# Patient Record
Sex: Male | Born: 1980 | Race: Black or African American | Hispanic: No | Marital: Single | Smoking: Current every day smoker
Health system: Southern US, Community
[De-identification: ages and names within clinical notes are randomized; demographics above are authoritative.]

---

## 2016-07-05 ENCOUNTER — Emergency Department
Admission: EM | Admit: 2016-07-05 | Discharge: 2016-07-05 | Disposition: A | Discharge status: Home / Self Care | Payer: Self-pay | Attending: Emergency Medicine | Admitting: Emergency Medicine

## 2016-07-05 ENCOUNTER — Encounter: Payer: Self-pay | Admitting: *Deleted

## 2016-07-05 DIAGNOSIS — R197 Diarrhea, unspecified: Secondary | ICD-10-CM

## 2016-07-05 DIAGNOSIS — F17200 Nicotine dependence, unspecified, uncomplicated: Secondary | ICD-10-CM | POA: Insufficient documentation

## 2016-07-05 LAB — COMPREHENSIVE METABOLIC PANEL
ALBUMIN: 3.8 g/dL (ref 3.5–5.0)
ALT: 17 U/L (ref 17–63)
ANION GAP: 4 — AB (ref 5–15)
AST: 24 U/L (ref 15–41)
Alkaline Phosphatase: 47 U/L (ref 38–126)
BILIRUBIN TOTAL: 0.5 mg/dL (ref 0.3–1.2)
BUN: 10 mg/dL (ref 6–20)
CO2: 27 mmol/L (ref 22–32)
Calcium: 8.9 mg/dL (ref 8.9–10.3)
Chloride: 108 mmol/L (ref 101–111)
Creatinine, Ser: 1.06 mg/dL (ref 0.61–1.24)
GLUCOSE: 88 mg/dL (ref 65–99)
POTASSIUM: 3.8 mmol/L (ref 3.5–5.1)
Sodium: 139 mmol/L (ref 135–145)
TOTAL PROTEIN: 6.9 g/dL (ref 6.5–8.1)

## 2016-07-05 LAB — CBC
HEMATOCRIT: 41 % (ref 40.0–52.0)
HEMOGLOBIN: 13.9 g/dL (ref 13.0–18.0)
MCH: 31.8 pg (ref 26.0–34.0)
MCHC: 33.8 g/dL (ref 32.0–36.0)
MCV: 93.9 fL (ref 80.0–100.0)
Platelets: 204 10*3/uL (ref 150–440)
RBC: 4.36 MIL/uL — AB (ref 4.40–5.90)
RDW: 13.8 % (ref 11.5–14.5)
WBC: 6 10*3/uL (ref 3.8–10.6)

## 2016-07-05 LAB — LIPASE, BLOOD: Lipase: 23 U/L (ref 11–51)

## 2016-07-05 MED ORDER — LOPERAMIDE HCL 2 MG PO TABS: 2.0000 mg | ORAL_TABLET | Freq: Four times a day (QID) | ORAL | 0 refills | Status: AC | PRN

## 2016-07-05 NOTE — ED Provider Notes (Signed)
[facility] Emergency Department Provider Note   ____________________________________________    I have reviewed the triage vital signs and the nursing notes.   HISTORY  Chief Complaint Abdominal Pain and Diarrhea     HPI Brad Harrison is a 35 y.o. male who presents with complaints of lower abdominal cramping and diarrhea. This started yesterday after he ate a [place] cheese steak pizza from [employer]. He reports today he is actually feeling better because his cramping has lessened. He had nausea but no vomiting. No fevers or chills. No recent travel. No sick contacts.   History reviewed. No pertinent past medical history.  There are no active problems to display for this patient.   History reviewed. No pertinent surgical history.  Prior to Admission medications   Medication Sig Start Date End Date Taking? Authorizing Provider  loperamide (IMODIUM A-D) 2 MG tablet Take 1 tablet (2 mg total) by mouth 4 (four) times daily as needed for diarrhea or loose stools. 07/05/16   Jene Everyobert Mikeisha Lemonds, MD     Allergies Jonne PlyAsa [aspirin]  History reviewed. No pertinent family history.  Social History Social History  Substance Use Topics  . Smoking status: Current Every Day Smoker  . Smokeless tobacco: Not on file  . Alcohol use No    Review of Systems  Constitutional: No fever/chills  Cardiovascular: Denies chest pain. Respiratory: Denies shortness of breath. Gastrointestinal: As above  Skin: Negative for rash. Neurological: Negative for headaches   10-point ROS otherwise negative.  ____________________________________________   PHYSICAL EXAM:  VITAL SIGNS: ED Triage Vitals  Enc Vitals Group     BP 07/05/16 0910 116/70     Pulse Rate 07/05/16 0910 60     Resp 07/05/16 0910 18     Temp 07/05/16 0910 97.8 F (36.6 C)     Temp Source 07/05/16 0910 Oral     SpO2 07/05/16 0910 100 %     Weight 07/05/16 0910 140 lb (63.5 kg)     Height  07/05/16 0910 5\' 10"  (1.778 m)     Head Circumference --      Peak Flow --      Pain Score 07/05/16 0913 6     Pain Loc --      Pain Edu? --      Excl. in GC? --     Constitutional: Alert and oriented. No acute distress. Pleasant and interactive Eyes: Conjunctivae are normal.  Head: Atraumatic. Nose: No congestion/rhinnorhea. Mouth/Throat: Mucous membranes are moist.   Neck:  Painless ROM Cardiovascular: Normal rate, regular rhythm.   Good peripheral circulation. Respiratory: Normal respiratory effort.  No retractions.  Gastrointestinal: Soft and nontender. No distention.  No CVA tenderness. Genitourinary: deferred Musculoskeletal: No lower extremity tenderness nor edema.  Warm and well perfused Neurologic:  Normal speech and language. No gross focal neurologic deficits are appreciated.  Skin:  Skin is warm, dry and intact. No rash noted. Psychiatric: Mood and affect are normal. Speech and behavior are normal.  ____________________________________________   LABS (all labs ordered are listed, but only abnormal results are displayed)  Labs Reviewed  COMPREHENSIVE METABOLIC PANEL - Abnormal; Notable for the following:       Result Value   Anion gap 4 (*)    All other components within normal limits  CBC - Abnormal; Notable for the following:    RBC 4.36 (*)    All other components within normal limits  LIPASE, BLOOD  URINALYSIS COMPLETEWITH MICROSCOPIC ([facility] ONLY)  ____________________________________________  EKG  None ____________________________________________  RADIOLOGY  None ____________________________________________   PROCEDURES  Procedure(s) performed: No    Critical Care performed: No ____________________________________________   INITIAL IMPRESSION / ASSESSMENT AND PLAN / ED COURSE  Pertinent labs & imaging results that were available during my care of the patient were reviewed by me and considered in my medical decision making (see chart  for details).  Patient well-appearing in no distress. Abdominal exam is benign, lab work is unremarkable. Vitals are normal. Recommend supportive care  Clinical Course    ____________________________________________   FINAL CLINICAL IMPRESSION(S) / ED DIAGNOSES  Final diagnoses:  Diarrhea, unspecified type      NEW MEDICATIONS STARTED DURING THIS VISIT:  Discharge Medication List as of 07/05/2016 10:14 AM    START taking these medications   Details  loperamide (IMODIUM A-D) 2 MG tablet Take 1 tablet (2 mg total) by mouth 4 (four) times daily as needed for diarrhea or loose stools., Starting Sun 07/05/2016, Print         Note:  This document was prepared using Dragon voice recognition software and may include unintentional dictation errors.    Jene Everyobert Archibald Marchetta, MD 07/05/16 [phone]

## 2016-07-05 NOTE — ED Triage Notes (Signed)
States he ate [employer] yesterday about 4 pm and states abd pain and diarrhea since, states no relief with OTC meds

## 2018-01-03 ENCOUNTER — Encounter: Payer: Self-pay | Admitting: Emergency Medicine

## 2018-01-03 ENCOUNTER — Other Ambulatory Visit: Payer: Self-pay

## 2018-01-03 ENCOUNTER — Emergency Department
Admission: EM | Admit: 2018-01-03 | Discharge: 2018-01-03 | Disposition: A | Discharge status: Home / Self Care | Payer: Self-pay | Attending: Emergency Medicine | Admitting: Emergency Medicine

## 2018-01-03 DIAGNOSIS — F17210 Nicotine dependence, cigarettes, uncomplicated: Secondary | ICD-10-CM | POA: Insufficient documentation

## 2018-01-03 DIAGNOSIS — R221 Localized swelling, mass and lump, neck: Secondary | ICD-10-CM | POA: Insufficient documentation

## 2018-01-03 LAB — CBC WITH DIFFERENTIAL/PLATELET
Basophils Absolute: 0 10*3/uL (ref 0–0.1)
Basophils Relative: 1 %
EOS ABS: 0.2 10*3/uL (ref 0–0.7)
EOS PCT: 3 %
HCT: 43.3 % (ref 40.0–52.0)
Hemoglobin: 14.4 g/dL (ref 13.0–18.0)
LYMPHS ABS: 1.8 10*3/uL (ref 1.0–3.6)
LYMPHS PCT: 29 %
MCH: 32 pg (ref 26.0–34.0)
MCHC: 33.3 g/dL (ref 32.0–36.0)
MCV: 96.3 fL (ref 80.0–100.0)
MONO ABS: 0.7 10*3/uL (ref 0.2–1.0)
MONOS PCT: 11 %
Neutro Abs: 3.5 10*3/uL (ref 1.4–6.5)
Neutrophils Relative %: 56 %
PLATELETS: 258 10*3/uL (ref 150–440)
RBC: 4.5 MIL/uL (ref 4.40–5.90)
RDW: 14.2 % (ref 11.5–14.5)
WBC: 6.2 10*3/uL (ref 3.8–10.6)

## 2018-01-03 LAB — COMPREHENSIVE METABOLIC PANEL
ALT: 28 U/L (ref 17–63)
ANION GAP: 6 (ref 5–15)
AST: 39 U/L (ref 15–41)
Albumin: 3.7 g/dL (ref 3.5–5.0)
Alkaline Phosphatase: 56 U/L (ref 38–126)
BUN: 20 mg/dL (ref 6–20)
CHLORIDE: 107 mmol/L (ref 101–111)
CO2: 27 mmol/L (ref 22–32)
CREATININE: 1.09 mg/dL (ref 0.61–1.24)
Calcium: 8.9 mg/dL (ref 8.9–10.3)
Glucose, Bld: 84 mg/dL (ref 65–99)
Potassium: 3.8 mmol/L (ref 3.5–5.1)
SODIUM: 140 mmol/L (ref 135–145)
Total Bilirubin: 0.6 mg/dL (ref 0.3–1.2)
Total Protein: 6.8 g/dL (ref 6.5–8.1)

## 2018-01-03 NOTE — ED Provider Notes (Addendum)
[facility] Emergency Department Provider Note  ___________________________________________   First MD Initiated Contact with Patient 01/03/18 1327     (approximate)  I have reviewed the triage vital signs and the nursing notes.   HISTORY  Chief Complaint Mass   HPI Brad Harrison is a 37 y.o. male that any chronic medical conditions was presenting with a mass to the right side of his neck.  He says that the swelling and growth of this mass has been ongoing over the past 2 months and now it is becoming uncomfortable for him to sleep at night.  He denies any difficulty with swallowing or breathing.  Says that he does smoke.  Denies any dental pain.  Denies any weight loss or night sweats or fever.  Has never had swelling like this before.  No reported injury.  Does not have a primary care doctor.  Does not take any medications at home.  Denies any unintended weight loss.  History reviewed. No pertinent past medical history.  There are no active problems to display for this patient.   History reviewed. No pertinent surgical history.  Prior to Admission medications   Medication Sig Start Date End Date Taking? Authorizing Provider  loperamide (IMODIUM A-D) 2 MG tablet Take 1 tablet (2 mg total) by mouth 4 (four) times daily as needed for diarrhea or loose stools. 07/05/16   Jene Every, MD    Allergies Asa [aspirin]  No family history on file.  Social History Social History   Tobacco Use  . Smoking status: Current Every Day Smoker    Packs/day: 0.50    Types: Cigarettes  Substance Use Topics  . Alcohol use: No  . Drug use: Not on file    Review of Systems  Constitutional: No fever/chills Eyes: No visual changes. ENT: No sore throat. Cardiovascular: Denies chest pain. Respiratory: Denies shortness of breath. Gastrointestinal: No abdominal pain.  No nausea, no vomiting.  No diarrhea.  No constipation. Genitourinary: Negative for  dysuria. Musculoskeletal: Negative for back pain. Skin: Negative for rash. Neurological: Negative for headaches, focal weakness or numbness.   ____________________________________________   PHYSICAL EXAM:  VITAL SIGNS: ED Triage Vitals [01/03/18 1138]  Enc Vitals Group     BP (!) 132/100     Pulse Rate 81     Resp 20     Temp 98.3 F (36.8 C)     Temp Source Oral     SpO2 100 %     Weight 131 lb (59.4 kg)     Height 5\' 7"  (1.702 m)     Head Circumference      Peak Flow      Pain Score 8     Pain Loc      Pain Edu?      Excl. in GC?     Constitutional: Alert and oriented. Well appearing and in no acute distress. Eyes: Conjunctivae are normal.  Head: Atraumatic. Nose: No congestion/rhinnorhea. Mouth/Throat: Mucous membranes are moist.  Poor dentition.  No intraoral swelling or lesions. Neck: No stridor.    Large, soft mass to the right proximal side of the neck just inferior to the mandibular angle.  The mass is approximately 10 x 6 cm and oval-shaped.  It is mobile and soft and nontender.  It is nonpulsatile.  There is no overlying induration.  No warmth.  I do not feel fluctuance.  Cardiovascular: Normal rate, regular rhythm. Grossly normal heart sounds.  Good peripheral circulation. Respiratory: Normal  respiratory effort.  No retractions. Lungs CTAB. Gastrointestinal: Soft and nontender. No distention. No CVA tenderness. Musculoskeletal: No lower extremity tenderness nor edema.  No joint effusions. Neurologic:  Normal speech and language. No gross focal neurologic deficits are appreciated. Skin:  Skin is warm, dry and intact. No rash noted. Psychiatric: Mood and affect are normal. Speech and behavior are normal.  ____________________________________________   LABS (all labs ordered are listed, but only abnormal results are displayed)  Labs Reviewed  CBC WITH DIFFERENTIAL/PLATELET  COMPREHENSIVE METABOLIC PANEL    ____________________________________________  EKG   ____________________________________________  RADIOLOGY   ____________________________________________   PROCEDURES  Procedure(s) performed:   Procedures  Critical Care performed:   ____________________________________________   INITIAL IMPRESSION / ASSESSMENT AND PLAN / ED COURSE  Pertinent labs & imaging results that were available during my care of the patient were reviewed by me and considered in my medical decision making (see chart for details).  DDX: Enlarged lymph node, necrotic lymph node, abscess, infected cyst, congenital abnormality As part of my medical decision making, I reviewed the following data within the electronic MEDICAL RECORD NUMBER Notes from prior ED visits  Patient says that he is unable to stay for imaging as he needs to pick up his children.  Patient with reassuring blood work.  Mass is mobile and nonpulsatile.  I recommended that the patient stay for imaging to further differentiate the mass.  However, he says that he will likely need to return to the emergency department.  Is not of any outpatient follow-up.  He is aware that I am unable to make any definitive diagnosis based on lack of information at this time.  He is understanding of this plan and willing to comply.  Will be discharged home.  The patient appears well.  Is not in any distress.  Speaking with a normal voice.  ____________________________________________   FINAL CLINICAL IMPRESSION(S) / ED DIAGNOSES  Neck mass.    NEW MEDICATIONS STARTED DURING THIS VISIT:  New Prescriptions   No medications on file     Note:  This document was prepared using Dragon voice recognition software and may include unintentional dictation errors.     Schaevitz, Myra Rudeavid Matthew, MD 01/03/18 1417  Note sent to Dr. Smith Robertao through epic alerting her of the patient's visit and the elevated white blood cell count.    Myrna BlazerSchaevitz, David Matthew,  MD 01/03/18 1430

## 2018-01-03 NOTE — ED Triage Notes (Signed)
First Nurse Note:  C/O right neck swelling for several months.  Arrives today with C/O pain to neck at area of swelling.

## 2018-01-03 NOTE — ED Notes (Signed)
Pt has large knot noted on left side of throat.

## 2018-01-03 NOTE — ED Triage Notes (Signed)
Large fluctuant mass L neck x 2 months. Denies injury. Denies fevers. No resp distress. Became painful x 2 days.

## 2018-06-23 ENCOUNTER — Other Ambulatory Visit: Payer: Self-pay

## 2018-06-23 ENCOUNTER — Emergency Department: Payer: Self-pay

## 2018-06-23 ENCOUNTER — Encounter: Payer: Self-pay | Admitting: Emergency Medicine

## 2018-06-23 ENCOUNTER — Emergency Department
Admission: EM | Admit: 2018-06-23 | Discharge: 2018-06-23 | Disposition: A | Discharge status: Home / Self Care | Payer: Self-pay | Attending: Emergency Medicine | Admitting: Emergency Medicine

## 2018-06-23 DIAGNOSIS — F17210 Nicotine dependence, cigarettes, uncomplicated: Secondary | ICD-10-CM | POA: Insufficient documentation

## 2018-06-23 DIAGNOSIS — R221 Localized swelling, mass and lump, neck: Secondary | ICD-10-CM | POA: Insufficient documentation

## 2018-06-23 LAB — COMPREHENSIVE METABOLIC PANEL
ALBUMIN: 3.4 g/dL — AB (ref 3.5–5.0)
ALK PHOS: 55 U/L (ref 38–126)
ALT: 17 U/L (ref 0–44)
ANION GAP: 12 (ref 5–15)
AST: 29 U/L (ref 15–41)
BUN: 13 mg/dL (ref 6–20)
CHLORIDE: 99 mmol/L (ref 98–111)
CO2: 27 mmol/L (ref 22–32)
Calcium: 8.7 mg/dL — ABNORMAL LOW (ref 8.9–10.3)
Creatinine, Ser: 1.02 mg/dL (ref 0.61–1.24)
GFR calc non Af Amer: >60 mL/min (ref >60)
GLUCOSE: 135 mg/dL — AB (ref 70–99)
POTASSIUM: 3.5 mmol/L (ref 3.5–5.1)
SODIUM: 138 mmol/L (ref 135–145)
Total Bilirubin: 0.8 mg/dL (ref 0.3–1.2)
Total Protein: 7.4 g/dL (ref 6.5–8.1)

## 2018-06-23 LAB — CBC WITH DIFFERENTIAL/PLATELET
Abs Immature Granulocytes: 0.05 10*3/uL (ref 0.00–0.07)
BASOS ABS: 0 10*3/uL (ref 0.0–0.1)
BASOS PCT: 0 %
EOS PCT: 2 %
Eosinophils Absolute: 0.2 10*3/uL (ref 0.0–0.5)
HCT: 40.2 % (ref 39.0–52.0)
HEMOGLOBIN: 13.6 g/dL (ref 13.0–17.0)
Immature Granulocytes: 0 %
LYMPHS PCT: 20 %
Lymphs Abs: 2.3 10*3/uL (ref 0.7–4.0)
MCH: 31.3 pg (ref 26.0–34.0)
MCHC: 33.8 g/dL (ref 30.0–36.0)
MCV: 92.4 fL (ref 80.0–100.0)
Monocytes Absolute: 1.1 10*3/uL — ABNORMAL HIGH (ref 0.1–1.0)
Monocytes Relative: 10 %
NEUTROS ABS: 7.5 10*3/uL (ref 1.7–7.7)
NEUTROS PCT: 68 %
PLATELETS: 322 10*3/uL (ref 150–400)
RBC: 4.35 MIL/uL (ref 4.22–5.81)
RDW: 12.8 % (ref 11.5–15.5)
WBC: 11.2 10*3/uL — ABNORMAL HIGH (ref 4.0–10.5)
nRBC: 0 % (ref 0.0–0.2)

## 2018-06-23 MED ORDER — IOHEXOL 300 MG/ML  SOLN
75.0000 mL | Freq: Once | INTRAMUSCULAR | Status: AC | PRN
  Administered 2018-06-23: 75 mL via INTRAVENOUS

## 2018-06-23 NOTE — Discharge Instructions (Signed)
You have been seen in the emergency department today for a neck mass.  This is most consistent with a cystic mass although cancer is less likely, it would remain a possibility until a sample could be obtained.  I discussed your case with ENT surgeon Dr. Andee PolesVaught.  They are expecting a phone call to arrange an appointment to be seen in their office.  Return to the emergency department for any trouble breathing or swallowing, any fever or pain to this area.

## 2018-06-23 NOTE — ED Triage Notes (Addendum)
Pt presents to ED with c/o abscess to R neck that he has had for approx 5 months. Pt denies difficulty swallowing, no difficulty speaking, no airway obstruction. Pt with approx baseball sized abscess to R neck at this time. Abscess is hard to palpation at this time. Pt denies any pain at this time.

## 2018-06-23 NOTE — ED Provider Notes (Signed)
[facility] Emergency Department Provider Note  Time seen: 5:02 PM  I have reviewed the triage vital signs and the nursing notes.   HISTORY  Chief Complaint Abscess    HPI Brad Harrison is a 37 y.o. male with no past medical history who presents to the emergency department for right-sided neck mass.  According to the patient over the past 6 months he has had progressively growing mass to the right side of his neck.  Patient states it is not tender, denies any fever.  Denies any trouble swallowing or breathing.  Patient states this is the first day that he has had transportation available in childcare available so he came to the ER for evaluation.  Overall the patient appears well, no distress, reassuring vitals.   History reviewed. No pertinent past medical history.  There are no active problems to display for this patient.   History reviewed. No pertinent surgical history.  Prior to Admission medications   Medication Sig Start Date End Date Taking? Authorizing Provider  loperamide (IMODIUM A-D) 2 MG tablet Take 1 tablet (2 mg total) by mouth 4 (four) times daily as needed for diarrhea or loose stools. 07/05/16   Jene EveryKinner, Robert, MD    Allergies  Allergen Reactions  . Asa [Aspirin]     History reviewed. No pertinent family history.  Social History Social History   Tobacco Use  . Smoking status: Current Every Day Smoker    Packs/day: 0.50    Types: Cigarettes  . Smokeless tobacco: Never Used  Substance Use Topics  . Alcohol use: Yes    Comment: Occ  . Drug use: Yes    Frequency: 2.0 times per week    Types: Marijuana    Review of Systems Constitutional: Negative for fever. ENT: Large swelling/mass to right side of neck Cardiovascular: Negative for chest pain. Respiratory: Negative for shortness of breath. Gastrointestinal: Negative for abdominal pain Skin: Negative for skin rash or redness. Neurological: Negative for headache All  other ROS negative  ____________________________________________   PHYSICAL EXAM:  VITAL SIGNS: ED Triage Vitals  Enc Vitals Group     BP 06/23/18 1422 122/75     Pulse Rate 06/23/18 1422 87     Resp --      Temp 06/23/18 1422 97.7 F (36.5 C)     Temp Source 06/23/18 1422 Oral     SpO2 06/23/18 1422 100 %     Weight 06/23/18 1436 140 lb (63.5 kg)     Height 06/23/18 1436 5\' 8"  (1.727 m)     Head Circumference --      Peak Flow --      Pain Score 06/23/18 1436 0     Pain Loc --      Pain Edu? --      Excl. in GC? --    Constitutional: Alert and oriented. Well appearing and in no distress. Eyes: Normal exam ENT   Head: Normocephalic and atraumatic.   Mouth/Throat: Mucous membranes are moist.  Very large appearing mass on the right side of the neck, nontender to palpation.  No overlying skin color changes. Cardiovascular: Normal rate, regular rhythm. No murmur Respiratory: Normal respiratory effort without tachypnea nor retractions. Breath sounds are clear  Gastrointestinal: Soft and nontender. No distention.  Musculoskeletal: Nontender with normal range of motion in all extremities.  Neurologic:  Normal speech and language. No gross focal neurologic deficits  Skin:  Skin is warm, dry and intact.  Psychiatric: Mood and affect  are normal.    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  Differential would include neoplasm as well.  ____________________________________________   INITIAL IMPRESSION / ASSESSMENT AND PLAN / ED COURSE  Pertinent labs & imaging results that were available during my care of the patient were reviewed by me and considered in my medical decision making (see chart for details).  Patient presents to the emergency department for 6 months of progressively enlarging right neck mass.  The mass is nontender to palpation, no erythema or skin color  changes, no fever, no trouble swallowing or breathing.  Labs were done showing a very slight leukocytosis otherwise within normal limits.  Vitals are within normal limits including afebrile.  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  Given nontender with no skin color changes and normal vitals I do not suspect abscess.  Patient states the only reason he came today versus any other day was because he had transportation available.  I discussed the patient with Dr. Bud Face of ENT, he states that this is a simple appearing cyst the patient would benefit from removal and he would be happy to see the patient in the office.  I discussed this with the patient, he is agreeable to this plan of care.  ____________________________________________   FINAL CLINICAL IMPRESSION(S) / ED DIAGNOSES  Cystic mass on neck    Minna Antis, MD 06/23/18 1710

## 2018-07-11 ENCOUNTER — Encounter: Payer: Self-pay | Admitting: Emergency Medicine

## 2018-07-11 ENCOUNTER — Emergency Department
Admission: EM | Admit: 2018-07-11 | Discharge: 2018-07-11 | Disposition: A | Discharge status: Home / Self Care | Payer: Self-pay | Attending: Student in an Organized Health Care Education/Training Program | Admitting: Student in an Organized Health Care Education/Training Program

## 2018-07-11 ENCOUNTER — Other Ambulatory Visit: Payer: Self-pay

## 2018-07-11 ENCOUNTER — Emergency Department: Payer: Self-pay

## 2018-07-11 DIAGNOSIS — J020 Streptococcal pharyngitis: Secondary | ICD-10-CM | POA: Insufficient documentation

## 2018-07-11 DIAGNOSIS — R221 Localized swelling, mass and lump, neck: Secondary | ICD-10-CM

## 2018-07-11 DIAGNOSIS — F17210 Nicotine dependence, cigarettes, uncomplicated: Secondary | ICD-10-CM | POA: Insufficient documentation

## 2018-07-11 DIAGNOSIS — F1210 Cannabis abuse, uncomplicated: Secondary | ICD-10-CM | POA: Insufficient documentation

## 2018-07-11 LAB — COMPREHENSIVE METABOLIC PANEL
ALT: 28 U/L (ref 0–44)
ANION GAP: 12 (ref 5–15)
AST: 31 U/L (ref 15–41)
Albumin: 3.5 g/dL (ref 3.5–5.0)
Alkaline Phosphatase: 53 U/L (ref 38–126)
BUN: 14 mg/dL (ref 6–20)
CHLORIDE: 98 mmol/L (ref 98–111)
CO2: 21 mmol/L — AB (ref 22–32)
CREATININE: 1.04 mg/dL (ref 0.61–1.24)
Calcium: 8.8 mg/dL — ABNORMAL LOW (ref 8.9–10.3)
GFR calc Af Amer: >60 mL/min (ref >60)
GFR calc non Af Amer: >60 mL/min (ref >60)
GLUCOSE: 165 mg/dL — AB (ref 70–99)
Potassium: 3.6 mmol/L (ref 3.5–5.1)
SODIUM: 131 mmol/L — AB (ref 135–145)
Total Bilirubin: 0.8 mg/dL (ref 0.3–1.2)
Total Protein: 8.7 g/dL — ABNORMAL HIGH (ref 6.5–8.1)

## 2018-07-11 LAB — CBC WITH DIFFERENTIAL/PLATELET
ABS IMMATURE GRANULOCYTES: 0.07 10*3/uL (ref 0.00–0.07)
Basophils Absolute: 0.1 10*3/uL (ref 0.0–0.1)
Basophils Relative: 0 %
EOS PCT: 1 %
Eosinophils Absolute: 0.2 10*3/uL (ref 0.0–0.5)
HEMATOCRIT: 41.6 % (ref 39.0–52.0)
HEMOGLOBIN: 13.5 g/dL (ref 13.0–17.0)
Immature Granulocytes: 1 %
LYMPHS ABS: 1.8 10*3/uL (ref 0.7–4.0)
LYMPHS PCT: 12 %
MCH: 30.3 pg (ref 26.0–34.0)
MCHC: 32.5 g/dL (ref 30.0–36.0)
MCV: 93.3 fL (ref 80.0–100.0)
MONO ABS: 1.5 10*3/uL — AB (ref 0.1–1.0)
Monocytes Relative: 10 %
NEUTROS ABS: 11.3 10*3/uL — AB (ref 1.7–7.7)
Neutrophils Relative %: 76 %
Platelets: 461 10*3/uL — ABNORMAL HIGH (ref 150–400)
RBC: 4.46 MIL/uL (ref 4.22–5.81)
RDW: 13 % (ref 11.5–15.5)
WBC: 14.9 10*3/uL — ABNORMAL HIGH (ref 4.0–10.5)
nRBC: 0 % (ref 0.0–0.2)

## 2018-07-11 LAB — GROUP A STREP BY PCR: Group A Strep by PCR: DETECTED — AB

## 2018-07-11 MED ORDER — DEXAMETHASONE SODIUM PHOSPHATE 10 MG/ML IJ SOLN
INTRAMUSCULAR | Status: AC
  Filled 2018-07-11: qty 1

## 2018-07-11 MED ORDER — HYDROCODONE-ACETAMINOPHEN 5-325 MG PO TABS: 1.0000 | ORAL_TABLET | ORAL | 0 refills | Status: AC | PRN

## 2018-07-11 MED ORDER — AMOXICILLIN 500 MG PO CAPS: 500.0000 mg | ORAL_CAPSULE | Freq: Three times a day (TID) | ORAL | 0 refills | Status: AC

## 2018-07-11 MED ORDER — DEXAMETHASONE 1 MG/ML PO CONC
10.0000 mg | Freq: Once | ORAL | Status: AC
  Administered 2018-07-11: 10 mg via ORAL
  Filled 2018-07-11: qty 10

## 2018-07-11 MED ORDER — HYDROCODONE-ACETAMINOPHEN 5-325 MG PO TABS
1.0000 | ORAL_TABLET | Freq: Once | ORAL | Status: AC
  Administered 2018-07-11: 1 via ORAL
  Filled 2018-07-11: qty 1

## 2018-07-11 NOTE — ED Triage Notes (Signed)
Says abscess on right side of neck for about 7 months.  Increased pain for 2 days.  Says he is having trouble swollowing and he hasnt been able to eat. No fever.

## 2018-07-11 NOTE — ED Notes (Signed)
Waiting on pharmacy to send up medication

## 2018-07-11 NOTE — ED Provider Notes (Signed)
[facility] Emergency Department Provider Note    First MD Initiated Contact with Patient 07/11/18 1212     (approximate)  I have reviewed the triage vital signs and the nursing notes.   HISTORY  Chief Complaint Abscess    HPI Brad Harrison is a 37 y.o. male presents the ER for evaluation of swelling and pain to his right neck.  States he is had a "abscess "there for the past 7 months.  States he has been seen multiple times but was referred to ENT and could not afford treatment.   Denies any fevers.  States it hurts whenever he swallows.  This is new.  Does not have any change of voice.  Family states that this is been steadily progressing over the past 7months.  Denies any nausea or vomiting.  No numbness or tingling.  No chest pain or shortness of breath.   History reviewed. No pertinent past medical history. No family history on file. History reviewed. No pertinent surgical history. There are no active problems to display for this patient.     Prior to Admission medications   Medication Sig Start Date End Date Taking? Authorizing Provider  amoxicillin (AMOXIL) 500 MG capsule Take 1 capsule (500 mg total) by mouth 3 (three) times daily for 7 days. 07/11/18 07/18/18  Willy Eddyobinson, Denesha Brouse, MD  HYDROcodone-acetaminophen (NORCO) 5-325 MG tablet Take 1 tablet by mouth every 4 (four) hours as needed for moderate pain. 07/11/18   Willy Eddyobinson, Keller Bounds, MD  loperamide (IMODIUM A-D) 2 MG tablet Take 1 tablet (2 mg total) by mouth 4 (four) times daily as needed for diarrhea or loose stools. 07/05/16   Jene EveryKinner, Robert, MD    Allergies Jonne PlyAsa [aspirin]    Social History Social History   Tobacco Use  . Smoking status: Current Every Day Smoker    Packs/day: 0.50    Types: Cigarettes  . Smokeless tobacco: Never Used  Substance Use Topics  . Alcohol use: Yes    Comment: Occ  . Drug use: Yes    Frequency: 2.0 times per week    Types: Marijuana    Review of  Systems Patient denies headaches, rhinorrhea, blurry vision, numbness, shortness of breath, chest pain, edema, cough, abdominal pain, nausea, vomiting, diarrhea, dysuria, fevers, rashes or hallucinations unless otherwise stated above in HPI. ____________________________________________   PHYSICAL EXAM:  VITAL SIGNS: Vitals:   07/11/18 1434 07/11/18 1503  BP:  113/85  Pulse: 95 98  Resp:  18  Temp:    SpO2: 96% 98%    Constitutional: Alert and oriented.  Eyes: Conjunctivae are normal.  Head: Atraumatic. Nose: No congestion/rhinnorhea. Mouth/Throat: Mucous membranes are moist.   Neck: No stridor. Painless ROM.  Cardiovascular: Normal rate, regular rhythm. Grossly normal heart sounds.  Good peripheral circulation. Respiratory: Normal respiratory effort.  No retractions. Lungs CTAB. Gastrointestinal: Soft and nontender. No distention. No abdominal bruits. No CVA tenderness. Genitourinary:  Musculoskeletal: No lower extremity tenderness nor edema.  No joint effusions. Neurologic:  Normal speech and language. No gross focal neurologic deficits are appreciated. No facial droop Skin:  Skin is warm, dry and intact. No rash noted. Psychiatric: Mood and affect are normal. Speech and behavior are normal.  ____________________________________________   LABS (all labs ordered are listed, but only abnormal results are displayed)  Results for orders placed or performed during the hospital encounter of 07/11/18 (from the past 24 hour(s))  Comprehensive metabolic panel     Status: Abnormal   Collection Time: 07/11/18  10:48 AM  Result Value Ref Range   Sodium 131 (L) 135 - 145 mmol/L   Potassium 3.6 3.5 - 5.1 mmol/L   Chloride 98 98 - 111 mmol/L   CO2 21 (L) 22 - 32 mmol/L   Glucose, Bld 165 (H) 70 - 99 mg/dL   BUN 14 6 - 20 mg/dL   Creatinine, Ser 1.61 0.61 - 1.24 mg/dL   Calcium 8.8 (L) 8.9 - 10.3 mg/dL   Total Protein 8.7 (H) 6.5 - 8.1 g/dL   Albumin 3.5 3.5 - 5.0 g/dL   AST 31 15  - 41 U/L   ALT 28 0 - 44 U/L   Alkaline Phosphatase 53 38 - 126 U/L   Total Bilirubin 0.8 0.3 - 1.2 mg/dL   GFR calc non Af Amer >60 >60 mL/min   GFR calc Af Amer >60 >60 mL/min   Anion gap 12 5 - 15  CBC with Differential     Status: Abnormal   Collection Time: 07/11/18 10:48 AM  Result Value Ref Range   WBC 14.9 (H) 4.0 - 10.5 K/uL   RBC 4.46 4.22 - 5.81 MIL/uL   Hemoglobin 13.5 13.0 - 17.0 g/dL   HCT 09.6 04.5 - 40.9 %   MCV 93.3 80.0 - 100.0 fL   MCH 30.3 26.0 - 34.0 pg   MCHC 32.5 30.0 - 36.0 g/dL   RDW 81.1 91.4 - 78.2 %   Platelets 461 (H) 150 - 400 K/uL   nRBC 0.0 0.0 - 0.2 %   Neutrophils Relative % 76 %   Neutro Abs 11.3 (H) 1.7 - 7.7 K/uL   Lymphocytes Relative 12 %   Lymphs Abs 1.8 0.7 - 4.0 K/uL   Monocytes Relative 10 %   Monocytes Absolute 1.5 (H) 0.1 - 1.0 K/uL   Eosinophils Relative 1 %   Eosinophils Absolute 0.2 0.0 - 0.5 K/uL   Basophils Relative 0 %   Basophils Absolute 0.1 0.0 - 0.1 K/uL   Immature Granulocytes 1 %   Abs Immature Granulocytes 0.07 0.00 - 0.07 K/uL  Group A Strep by PCR ([facility] Only)     Status: Abnormal   Collection Time: 07/11/18  3:03 PM  Result Value Ref Range   Group A Strep by PCR DETECTED (A) NOT DETECTED   ____________________________________________ ____________________________________________  RADIOLOGY  I personally reviewed all radiographic images ordered to evaluate for the above acute complaints and reviewed radiology reports and findings.  These findings were personally discussed with the patient.  Please see medical record for radiology report.  ____________________________________________   PROCEDURES  Procedure(s) performed:  Procedures    Critical Care performed: no ____________________________________________   INITIAL IMPRESSION / ASSESSMENT AND PLAN / ED COURSE  Pertinent labs & imaging results that were available during my care of the patient were reviewed by me and considered in my medical decision  making (see chart for details).   DDX: Mass, abscess, strep throat, pharyngitis  Brad Harrison is a 37 y.o. who presents to the ED with large right-sided neck mass that is been present for several months.  Will repeat ultrasound to exclude evidence of infarct given worsening pain.  He is protecting his airway there is no stridor.  No trismus.  No uvular deviation but does have tonsillar erythema with some exudates.  High suspicion for strep throat.  Will send rapid strep.  Clinical Course as of Jul 11 1610  Mon Jul 11, 2018  1543 Ultrasound shows no evidence of abscess.  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  Patient nontoxic-appearing is tolerating oral hydration as well as oral pain medication.  Will place consult to [facility] for area nose and throat for further recommendations and close outpatient follow-up.   [PR]  1607 I have attempted making consult with Kendell Bane however patient states that he needs to go pick up his children.  Did test positive for strep throat which I do think would explain his acute presentation.  Patient states he does not want to wait for ENT to call back and states he prefer to follow-up as an outpatient which I think is reasonable.  He is clearly protecting his airway and not able to tolerate oral hydration and medications.  Have discussed with the patient and available family all diagnostics and treatments performed thus far and all questions were answered to the best of my ability. The patient demonstrates understanding and agreement with plan.    [PR]    Clinical Course User Index [PR] Willy Eddy, MD     As part of my medical decision making, I reviewed the following data within the electronic MEDICAL RECORD NUMBER Nursing notes reviewed and incorporated, Labs reviewed, notes from prior ED visits and [place] Controlled Substance Database   ____________________________________________   FINAL CLINICAL IMPRESSION(S) / ED DIAGNOSES  Final diagnoses:    Neck mass  Strep pharyngitis      NEW MEDICATIONS STARTED DURING THIS VISIT:  New Prescriptions   AMOXICILLIN (AMOXIL) 500 MG CAPSULE    Take 1 capsule (500 mg total) by mouth 3 (three) times daily for 7 days.   HYDROCODONE-ACETAMINOPHEN (NORCO) 5-325 MG TABLET    Take 1 tablet by mouth every 4 (four) hours as needed for moderate pain.     Note:  This document was prepared using Dragon voice recognition software and may include unintentional dictation errors.    Willy Eddy, MD 07/11/18 [id number]

## 2018-07-11 NOTE — ED Notes (Addendum)
Pt c/o knot/swelling to right side of neck increasing in size over the last 7 months (reports he has been to ED x4 since this started) - area is painful x24 hours - pt is now unable to eat or sleep Pt reports difficulty swallowing any solids - is able to drink water Denies shortness of breath

## 2018-07-11 NOTE — Discharge Instructions (Addendum)
Please take antibiotics as prescribed.  Return for any worsening symptoms including difficulty swallowing or having trouble catching her breath.

## 2018-07-11 NOTE — ED Notes (Signed)
After discussing with providers this pt will need to be seen by an MD - Herbert SetaHeather charge RN notified and will call back with a bed

## 2019-03-08 IMAGING — CT CT NECK W/ CM
4 of 5 series · 14 of 33 positions shown, 16 images · IV contrast (omnipaque)
Comparison: None.

CLINICAL DATA: 36-year-old male with chronic right neck mass,
reportedly x5 months. Patient denies pain.

EXAM:
CT NECK WITH CONTRAST
TECHNIQUE: Multidetector CT imaging of the neck was performed using the
standard protocol following the bolus administration of intravenous
contrast.
CONTRAST:  75mL OMNIPAQUE IOHEXOL 300 MG/ML  SOLN

[Series 2: axial neck · axial · 0.62mm/px · z∈[-231,-169]mm · 2 of 123 slices shown]
[im 31/123  bone]
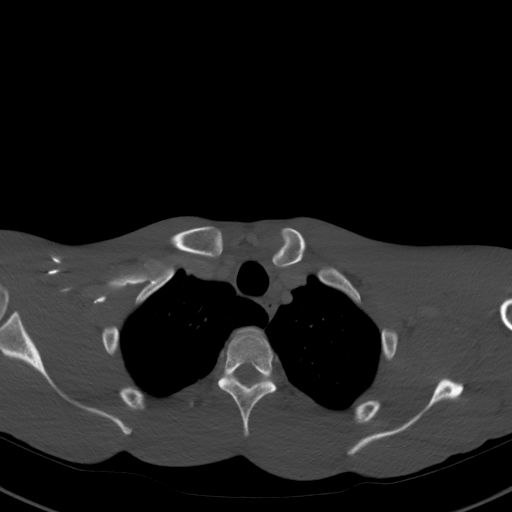
[im 62/123  bone]
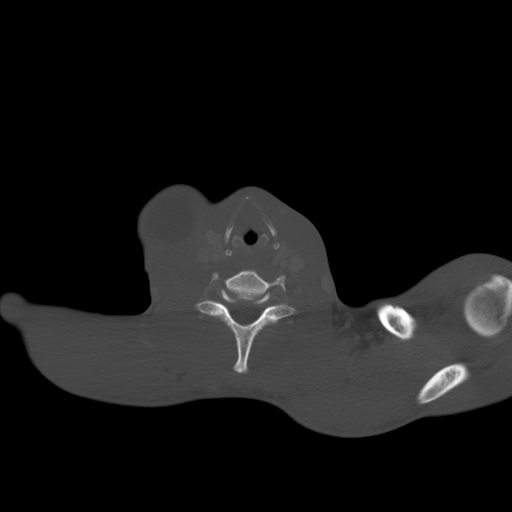

[Series 6: sag neck · sagittal · 0.58mm/px · 5 of 84 slices shown, 6 images]
[im 28/84  bone]
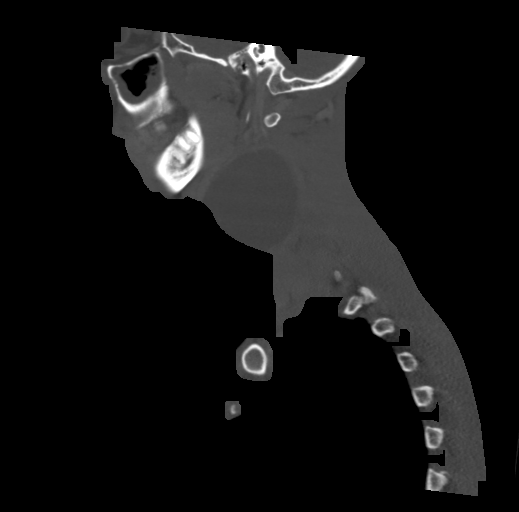
[im 35/84  bone]
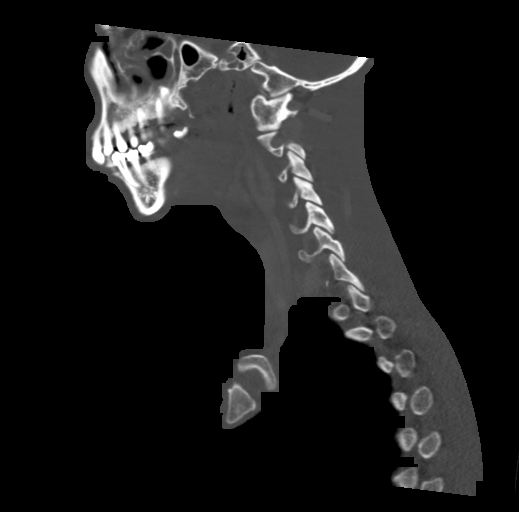
[im 42/84  soft-tissue]
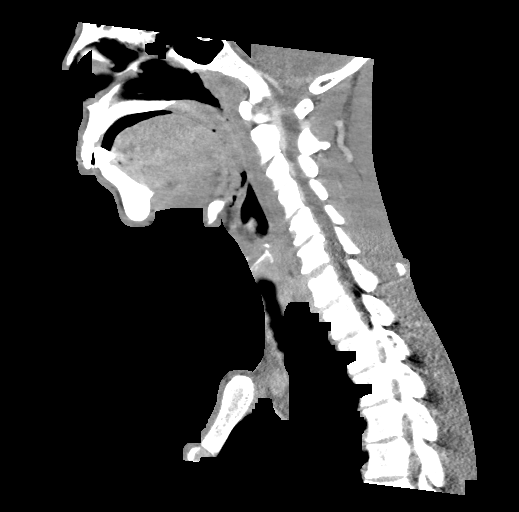
[im 42/84  bone]
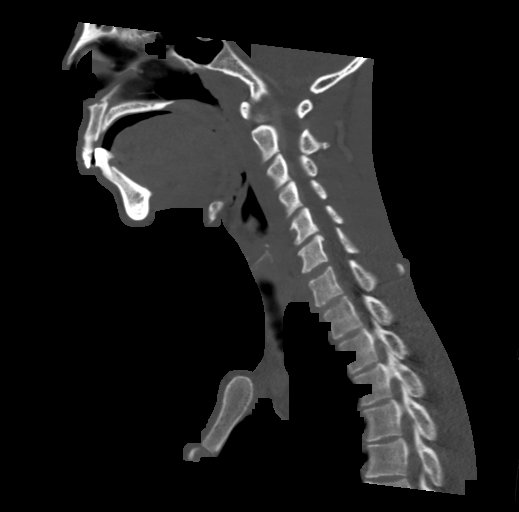
[im 49/84  bone]
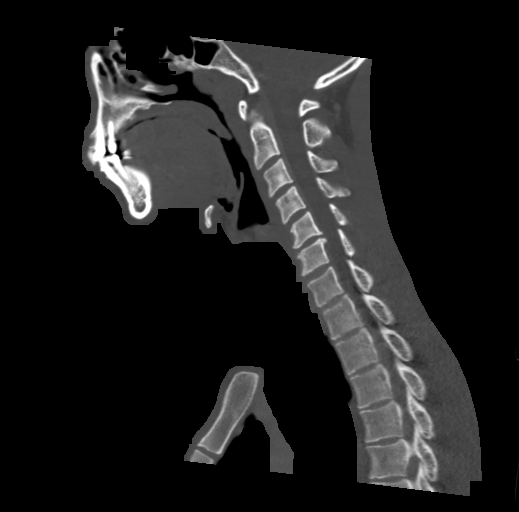
[im 56/84  bone]
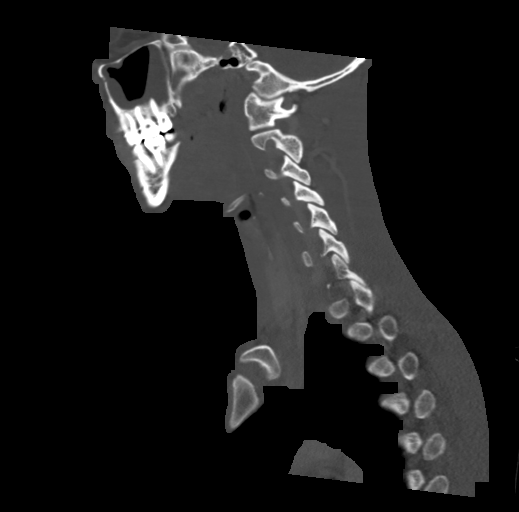

[Series 7: cor neck · coronal · 0.42mm/px · 3 of 131 slices shown]
[im 27/131  bone]
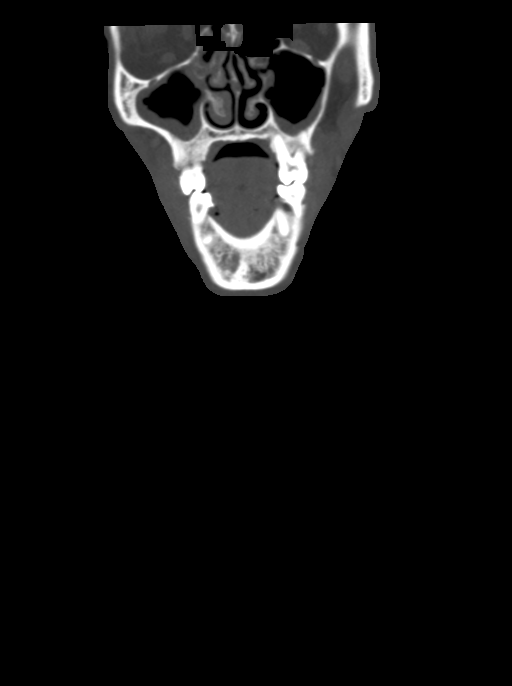
[im 53/131  bone]
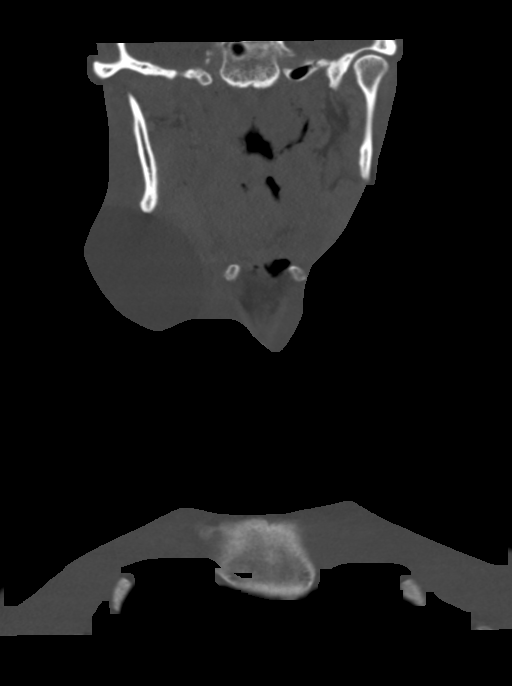
[im 79/131  bone]
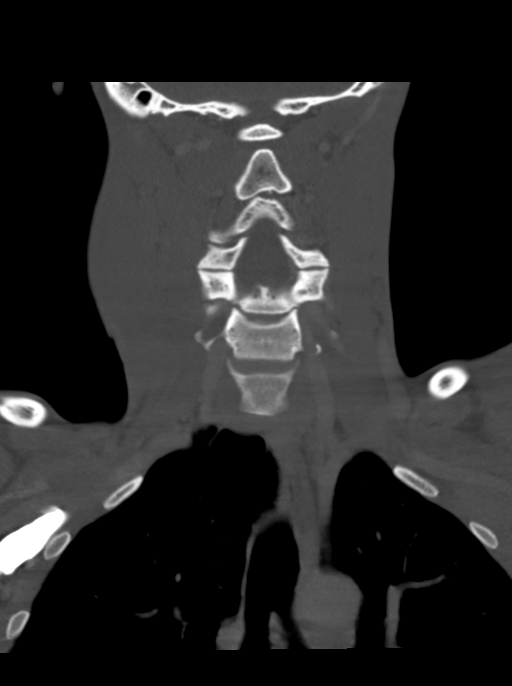

[Series 8: orthogonal ax · axial · 0.43mm/px · z∈[-294,-124]mm · 4 of 152 slices shown, 5 images]
[im 31/152  soft-tissue]
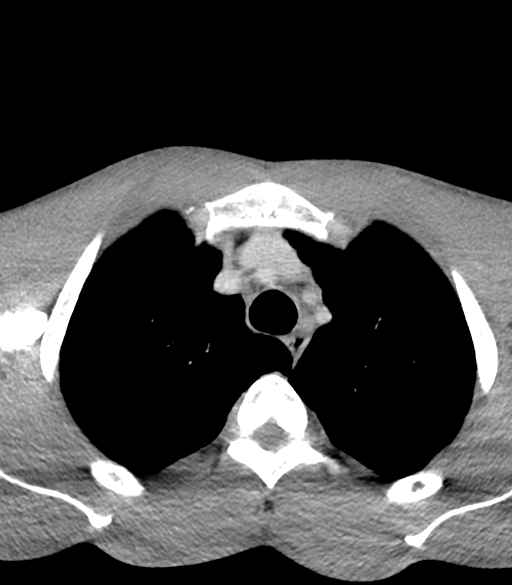
[im 31/152  bone]
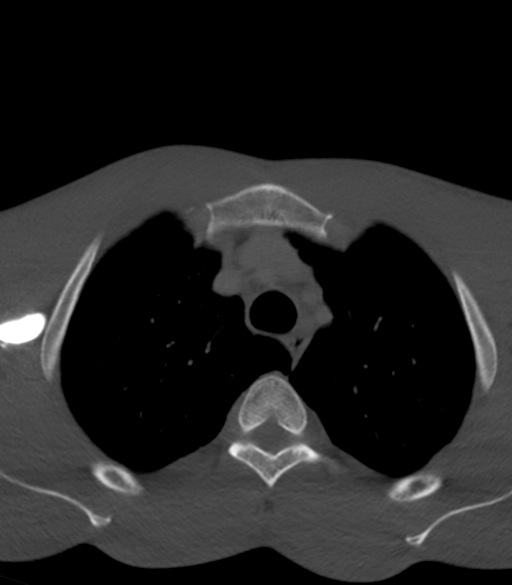
[im 61/152  bone]
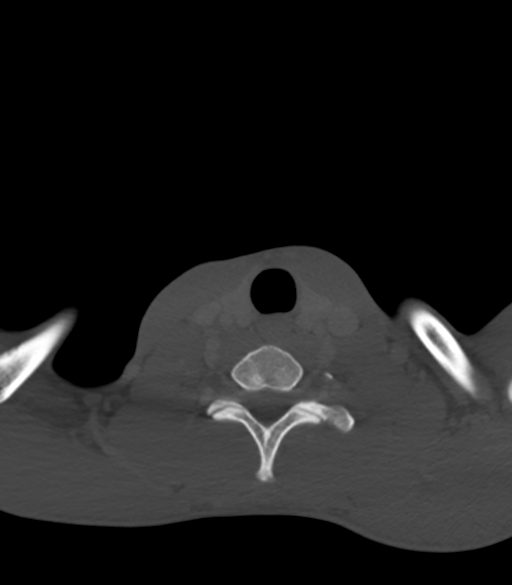
[im 91/152  bone]
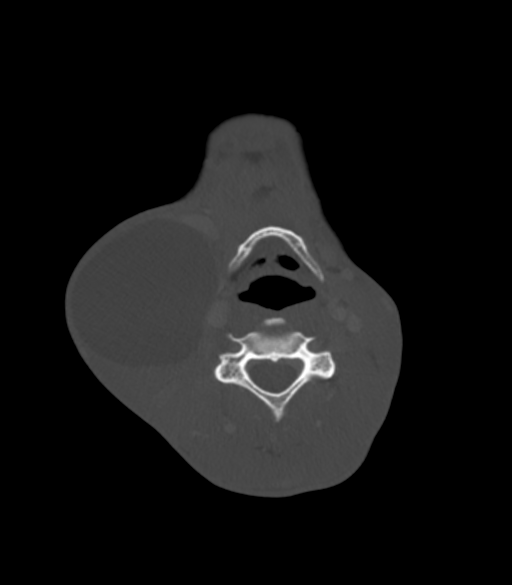
[im 121/152  bone]
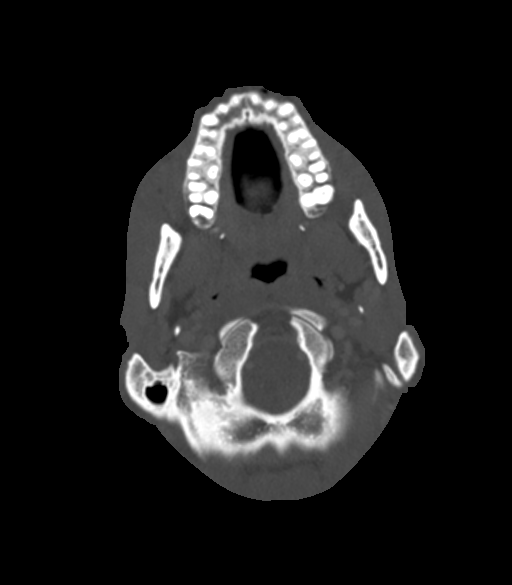

[14 of 33 positions shown; findings below may reference images not displayed]

FINDINGS: Pharynx and larynx: The glottis is closed. Other laryngeal soft
tissue contours are normal. The pharyngeal soft tissue contours are
within normal limits. Parapharyngeal and retropharyngeal spaces
appear normal.

Salivary glands: Negative sublingual space. The left submandibular
and parotid gland appear normal.

The right submandibular and parotid gland each demonstrate mild mass
effect related to the large cystic right neck mass. The inferior
right parotid gland is most inseparable from the mass as seen on
sagittal image 16, but otherwise the gland appears normal.

Thyroid: 3-4 millimeter hypodense nodule in the right lobe does not
meet consensus criteria for ultrasound. Otherwise negative.

Lymph nodes: There is a large round cystic right neck mass
encompassing 59 x 58 by 76 millimeters (estimated volume 130
milliliters). With simple fluid density and no enhancing rim. The
superior aspect of this lesion is inseparable from and has a mild
clots sign with the inferior right parotid gland (sagittal image 16.
The mass is superficial to the right carotid space and anterior to
the right sternocleidomastoid muscle.

No superimposed solid lymphadenopathy or other cystic or
heterogeneous neck mass is identified.

Vascular: Major vascular structures in the neck and at the skull
base are patent. The right IJ is effaced by the cystic neck mass.

Limited intracranial: Negative.

Visualized orbits: Negative.

Mastoids and visualized paranasal sinuses: Chronic right lamina
papyracea fracture. Fluid level in the left maxillary sinus. Bubbly
opacity in the right maxillary sinus and moderate bilateral
maxillary mucosal thickening. Moderate ethmoid mucosal thickening.
Trace fluid level in the left sphenoid sinus. The tympanic cavities
and mastoids are clear.

Skeleton: Negative.

Upper chest: Paraseptal and centrilobular emphysema, moderate or
severe in the visible right lung. Normal visible mediastinum.
IMPRESSION: 1. Large 6-7 cm but simple appearing cystic mass in the right neck.
A large type 2 branchial cleft cyst is possible, although the
differential diagnosis includes a cystic right parotid gland
neoplasm and a large malignant lymph node.
A neck abscess is least likely / unlikely.
No pharyngeal or laryngeal mass identified. No lymphadenopathy
elsewhere.
Recommend ENT consultation and histologic correlation.
2. Emphysema ([id number]), which is at least moderate in the right
lung.
3. Acute paranasal sinusitis.

## 2019-03-28 ENCOUNTER — Telehealth: Payer: Self-pay

## 2019-03-28 NOTE — Telephone Encounter (Signed)
Ked Plasma 03/01/2019 +RPR; patient needs bloodwork. Call Tiera at [place] [phone] if patient comes into clinic or ER. FYI created. Aileen Fass, RN

## 2019-03-31 ENCOUNTER — Ambulatory Visit: Payer: Self-pay

## 2019-04-03 ENCOUNTER — Ambulatory Visit: Payer: Self-pay

## 2020-04-03 IMAGING — US US SOFT TISSUE HEAD/NECK
1 series · 14 of 25 positions shown · non-contrast
Comparison: Neck CT 06/23/2018

CLINICAL DATA: Gradually enlarging right neck mass.

EXAM:
ULTRASOUND OF HEAD/NECK SOFT TISSUES
TECHNIQUE: Ultrasound examination of the head and neck soft tissues was
performed in the area of clinical concern.

[Series 1: us soft tissue head/neck · 0.12mm/px · 73 acquisitions, 14 frames shown]
[im 1/73]
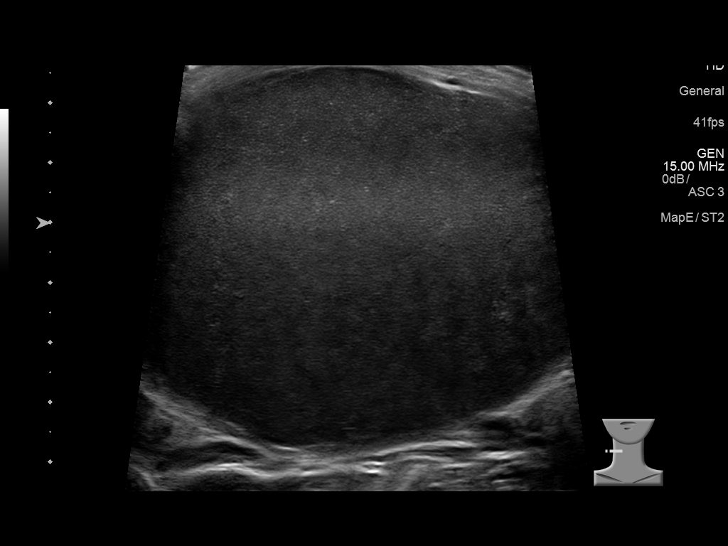
[im 7/73]
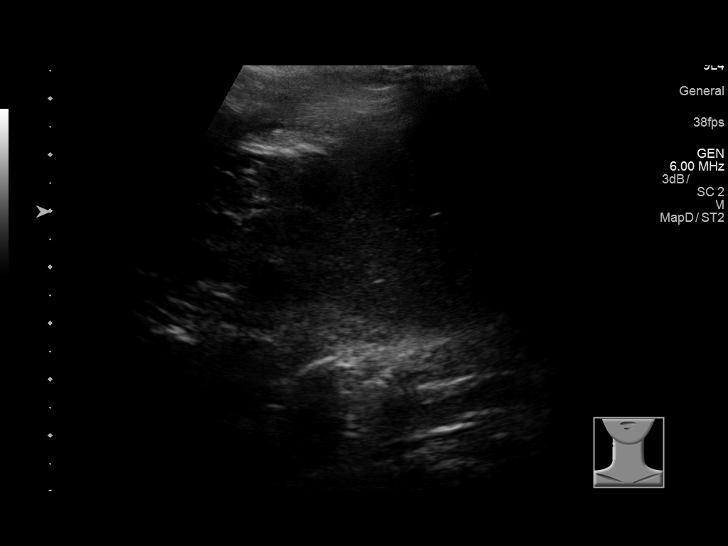
[im 13/73]
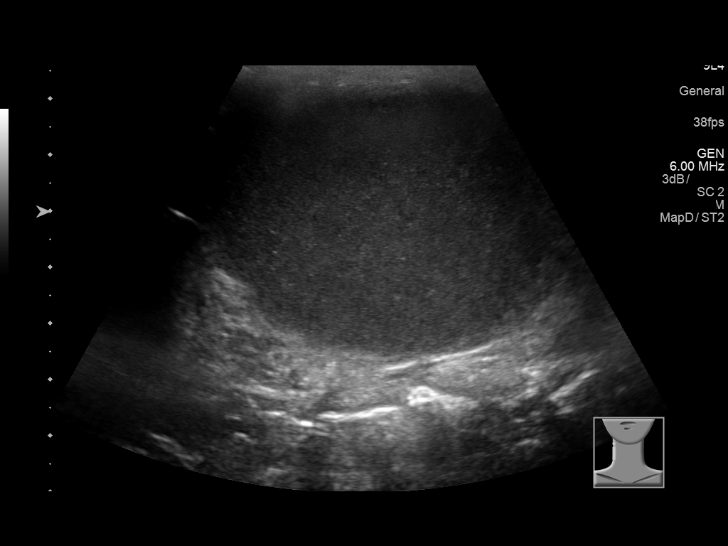
[im 19/73]
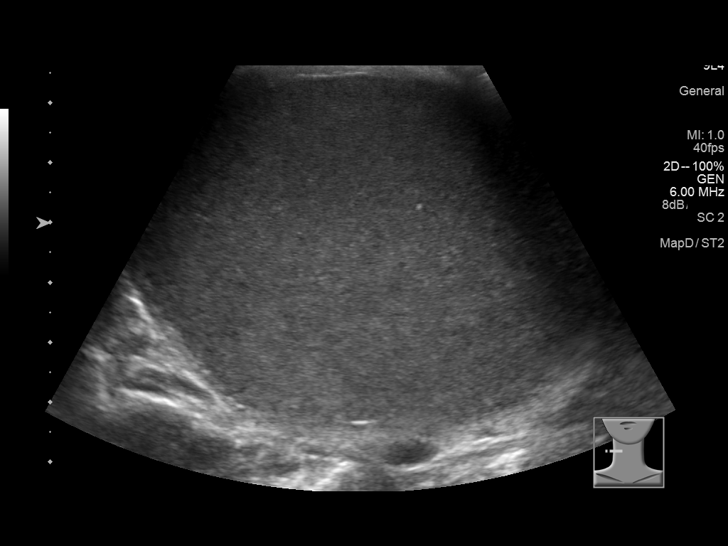
[im 25/73]
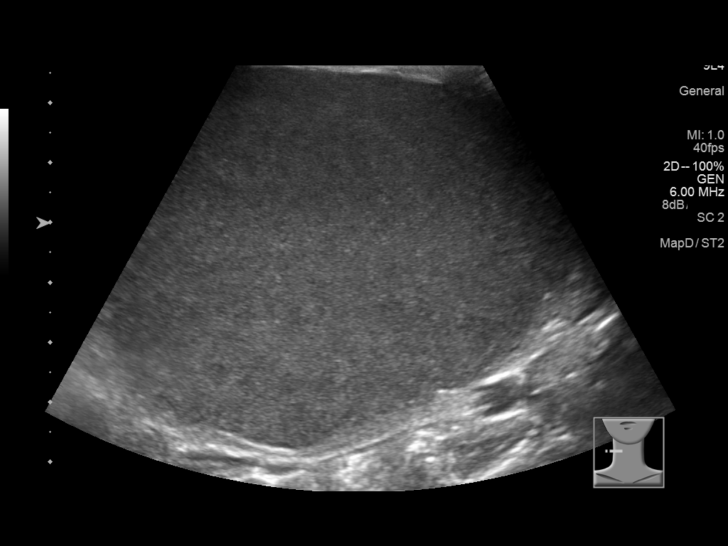
[im 28/73]
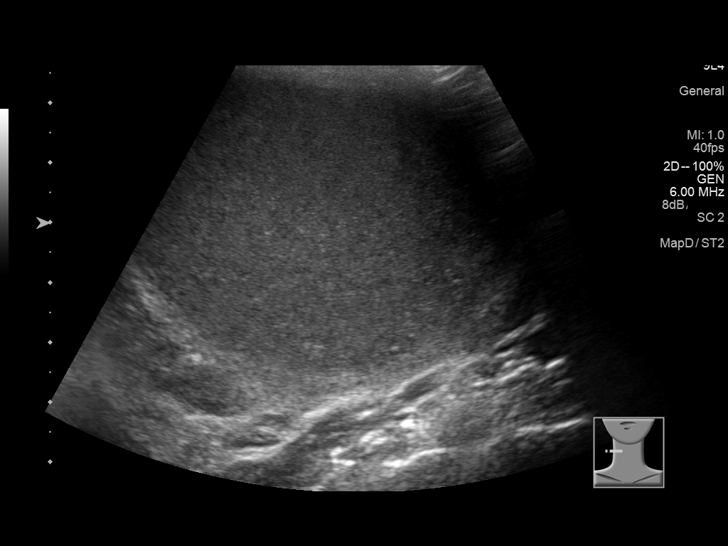
[im 34/73]
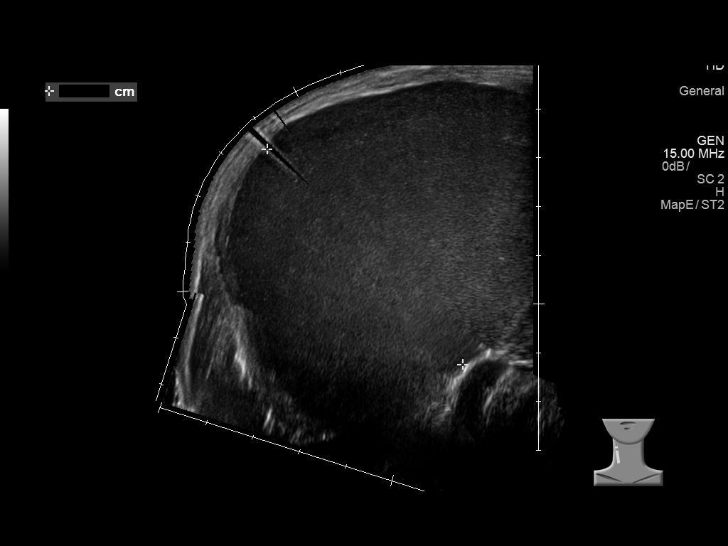
[im 40/73]
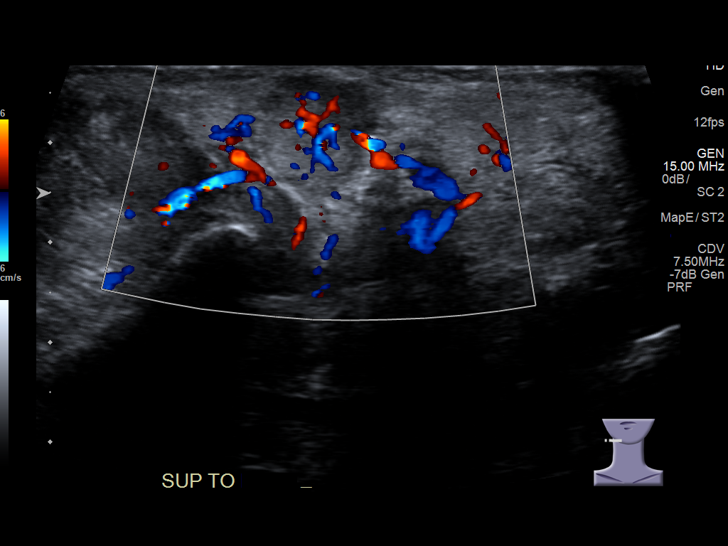
[im 46/73]
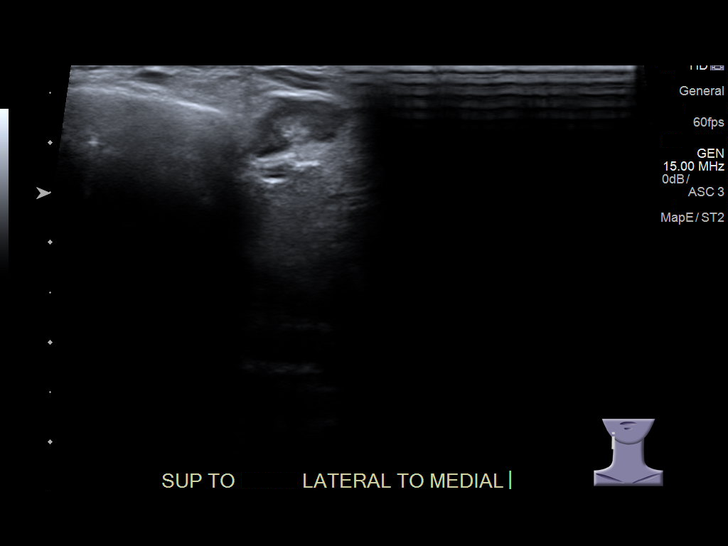
[im 49/73]
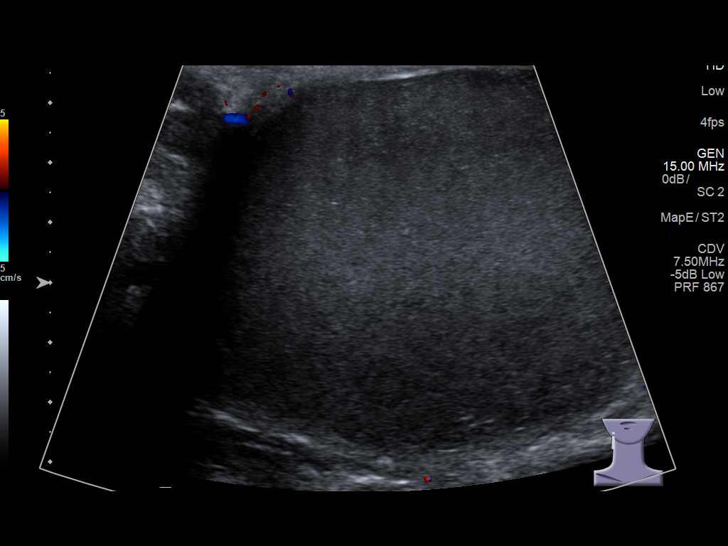
[im 55/73]
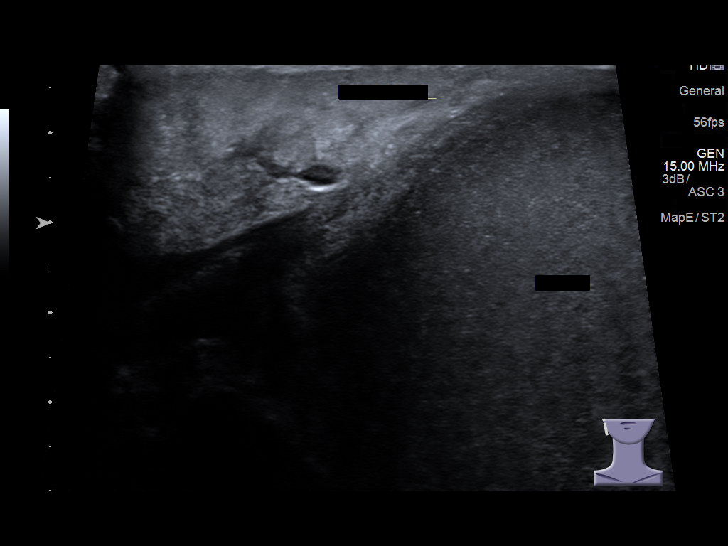
[im 61/73]
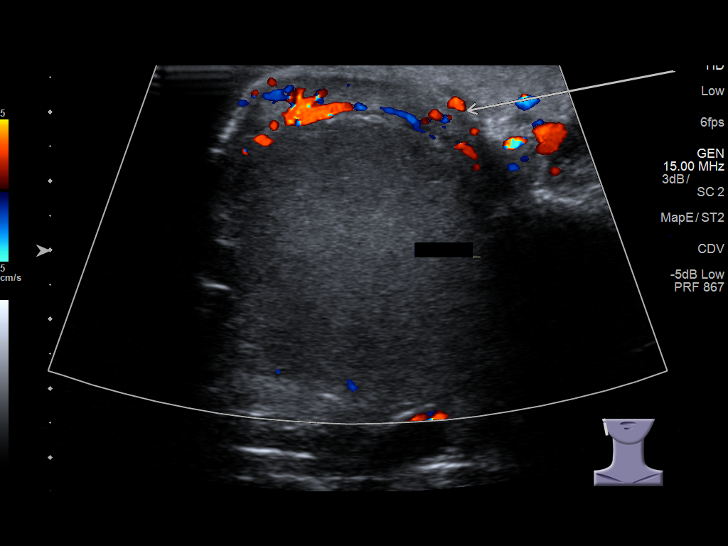
[im 67/73]
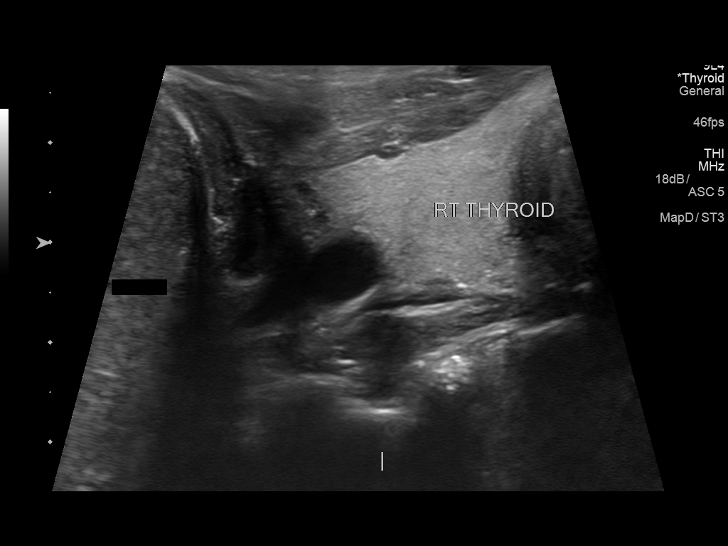
[im 73/73]
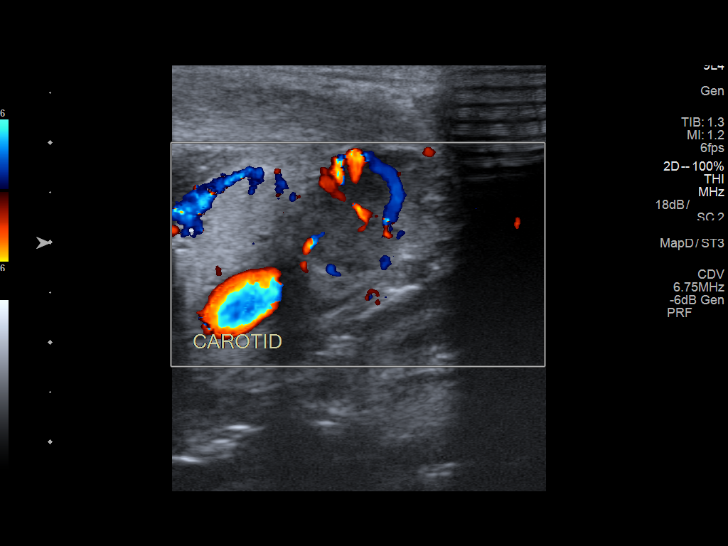

[14 of 25 positions shown; findings below may reference images not displayed]

FINDINGS: Re-demonstrated large right neck mass deep and anterior to the
sternocleidomastoid. The mass has internal particulate material
without visible septation or mural nodule. Mass measures up to
cm, and increased from prior. As noted previously the tail the
parotid is distorted and partially wraps around the upper margin of
the cyst, somewhat reminiscent of claw sign. There are mildly
enlarged regional right cervical lymph nodes, nonspecific. No change
in previously provided differential of branchial cleft cyst,
malignant cystic node, or parotid neoplasm.
IMPRESSION: 8.5 cm right neck cyst that has enlarged from CT 06/23/2018. No
change in the differential considerations of malignant node,
branchial cleft cyst, or parotid cyst. Recommend surgical
consultation.
